# Patient Record
Sex: Male | Born: 1970 | Race: Black or African American | Hispanic: No | Marital: Single | Smoking: Never smoker
Health system: Southern US, Community
[De-identification: ages and names within clinical notes are randomized; demographics above are authoritative.]

## PROBLEM LIST (undated history)

## (undated) DIAGNOSIS — Z6828 Body mass index (BMI) 28.0-28.9, adult: Secondary | ICD-10-CM

## (undated) HISTORY — DX: Body mass index (BMI) 28.0-28.9, adult: Z68.28

## (undated) HISTORY — PX: FINGER TENDON REPAIR: SHX1640

---

## 2000-02-10 ENCOUNTER — Encounter: Admission: RE | Admit: 2000-02-10 | Discharge: 2000-02-10 | Payer: Self-pay | Admitting: Family Medicine

## 2000-02-22 ENCOUNTER — Encounter: Admission: RE | Admit: 2000-02-22 | Discharge: 2000-02-22 | Payer: Self-pay | Admitting: Sports Medicine

## 2006-02-19 ENCOUNTER — Emergency Department (HOSPITAL_COMMUNITY): Admission: EM | Admit: 2006-02-19 | Discharge: 2006-02-19 | Payer: Self-pay | Admitting: Emergency Medicine

## 2011-10-19 ENCOUNTER — Emergency Department (HOSPITAL_COMMUNITY)
Admission: EM | Admit: 2011-10-19 | Discharge: 2011-10-19 | Disposition: A | Discharge status: Home / Self Care | Payer: Self-pay | Attending: Emergency Medicine | Admitting: Emergency Medicine

## 2011-10-19 ENCOUNTER — Encounter (HOSPITAL_COMMUNITY): Payer: Self-pay | Admitting: *Deleted

## 2011-10-19 ENCOUNTER — Emergency Department (HOSPITAL_COMMUNITY): Payer: Self-pay

## 2011-10-19 DIAGNOSIS — M25569 Pain in unspecified knee: Secondary | ICD-10-CM | POA: Insufficient documentation

## 2011-10-19 DIAGNOSIS — X500XXA Overexertion from strenuous movement or load, initial encounter: Secondary | ICD-10-CM | POA: Insufficient documentation

## 2011-10-19 DIAGNOSIS — S8990XA Unspecified injury of unspecified lower leg, initial encounter: Secondary | ICD-10-CM | POA: Insufficient documentation

## 2011-10-19 MED ORDER — HYDROCODONE-ACETAMINOPHEN 5-325 MG PO TABS: 1.0000 | ORAL_TABLET | Freq: Four times a day (QID) | ORAL | Status: AC | PRN

## 2011-10-19 MED ORDER — NAPROXEN 500 MG PO TABS: 500.0000 mg | ORAL_TABLET | Freq: Two times a day (BID) | ORAL | Status: DC

## 2011-10-19 NOTE — ED Notes (Signed)
The patient is AOx4 and comfortable with his discharge instructions. 

## 2011-10-19 NOTE — Progress Notes (Signed)
Orthopedic Tech Progress Note Patient Details:  Colton Elliott 01/29/1971 [id number]  Ortho Devices Type of Ortho Device: Crutches;Knee Immobilizer Ortho Device/Splint Location: applied to Right LE Ortho Device/Splint Interventions: Application   Asia R Thompson 10/19/2011, 6:35 AM
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
## 2012-08-26 ENCOUNTER — Emergency Department (HOSPITAL_COMMUNITY)
Admission: EM | Admit: 2012-08-26 | Discharge: 2012-08-26 | Disposition: A | Discharge status: Home / Self Care | Payer: Self-pay | Attending: Emergency Medicine | Admitting: Emergency Medicine

## 2012-08-26 ENCOUNTER — Encounter (HOSPITAL_COMMUNITY): Payer: Self-pay | Admitting: Emergency Medicine

## 2012-08-26 DIAGNOSIS — S61012A Laceration without foreign body of left thumb without damage to nail, initial encounter: Secondary | ICD-10-CM

## 2012-08-26 DIAGNOSIS — Y9389 Activity, other specified: Secondary | ICD-10-CM | POA: Insufficient documentation

## 2012-08-26 DIAGNOSIS — S61209A Unspecified open wound of unspecified finger without damage to nail, initial encounter: Secondary | ICD-10-CM | POA: Insufficient documentation

## 2012-08-26 DIAGNOSIS — Y9289 Other specified places as the place of occurrence of the external cause: Secondary | ICD-10-CM | POA: Insufficient documentation

## 2012-08-26 DIAGNOSIS — Z9889 Other specified postprocedural states: Secondary | ICD-10-CM | POA: Insufficient documentation

## 2012-08-26 DIAGNOSIS — W268XXA Contact with other sharp object(s), not elsewhere classified, initial encounter: Secondary | ICD-10-CM | POA: Insufficient documentation

## 2012-08-26 DIAGNOSIS — Z23 Encounter for immunization: Secondary | ICD-10-CM | POA: Insufficient documentation

## 2012-08-26 MED ORDER — LIDOCAINE HCL 1 % IJ SOLN
INTRAMUSCULAR | Status: AC
  Filled 2012-08-26: qty 20

## 2012-08-26 MED ORDER — TETANUS-DIPHTH-ACELL PERTUSSIS 5-2.5-18.5 LF-MCG/0.5 IM SUSP
0.5000 mL | Freq: Once | INTRAMUSCULAR | Status: AC
  Administered 2012-08-26: 0.5 mL via INTRAMUSCULAR
  Filled 2012-08-26: qty 0.5

## 2012-08-26 NOTE — ED Notes (Signed)
Patient is alert and oriented x3.  He was given DC instructions and follow up visit instructions.  Patient gave verbal understanding.  He was DC ambulatory under his own power to home.  V/S stable.  He was not showing any signs of distress on DC 

## 2012-08-26 NOTE — ED Provider Notes (Signed)
History     CSN: [id number]  Arrival date & time 08/26/12  0006   First MD Initiated Contact with Patient 08/26/12 0104      Chief Complaint  Patient presents with  . Extremity Laceration   HPI  History provided by the patient. The patient is a 42 year old male with no significant PMH presents with laceration to his left thumb. Patient states he is removing trash, trash can and as he reached it was his left hand to grab the bottom of the back he cut his thumb on something sharp. He is unsure what cut his thumb. There was associated bleeding but this was controlled with pressure. He denied having any reduced range of motion of the thumb. There was no weakness or numbness to the thumb. No other injuries. No other aggravating or alleviating factors. No other associated symptoms.    History reviewed. No pertinent past medical history.  Past Surgical History  Procedure Laterality Date  . Finger tendon repair      Family History  Problem Relation Age of Onset  . Hypertension Mother   . Diabetes Father     History  Substance Use Topics  . Smoking status: Never Smoker   . Smokeless tobacco: Never Used  . Alcohol Use: No      Review of Systems  Neurological: Negative for weakness and numbness.  All other systems reviewed and are negative.    Allergies  Review of patient's allergies indicates no known allergies.  Home Medications   Current Outpatient Rx  Name  Route  Sig  Dispense  Refill  . guaiFENesin (MUCINEX) 600 MG 12 hr tablet   Oral   Take 1,200 mg by mouth 2 (two) times daily as needed for congestion.         Marland Kitchen loratadine (CLARITIN) 10 MG tablet   Oral   Take 10 mg by mouth daily.           BP 136/78  Pulse 84  Temp(Src) 98.2 F (36.8 C) (Oral)  Resp 14  Ht 5\' 7"  (1.702 m)  Wt 165 lb (74.844 kg)  BMI 25.84 kg/m2  SpO2 96%  Physical Exam  Nursing note and vitals reviewed. Constitutional: He is oriented to person, place, and time. He appears  well-developed and well-nourished. No distress.  HENT:  Head: Normocephalic and atraumatic.  Cardiovascular: Normal rate and regular rhythm.   Pulmonary/Chest: Effort normal and breath sounds normal.  Musculoskeletal: Normal range of motion.  Semicircular laceration to the palmar surface of the proximal left thumb. No deep structure involvement through full range of motion. Normal strength against resistance in all directions of thumb. Normal distal mediolateral sensations to the thumb. Normal cap refill less than 2 seconds.  Neurological: He is alert and oriented to person, place, and time.  Skin: Skin is warm.  Psychiatric: He has a normal mood and affect. His behavior is normal.    ED Course  Procedures   LACERATION REPAIR Performed by: Angus Seller Authorized by: Angus Seller Consent: Verbal consent obtained. Risks and benefits: risks, benefits and alternatives were discussed Consent given by: patient Patient identity confirmed: provided demographic data Prepped and Draped in normal sterile fashion Wound explored  Laceration Location: Left thumb  Laceration Length: 2 cm  No Foreign Bodies seen or palpated  Anesthesia: local infiltration  Local anesthetic: lidocaine 2% without epinephrine  Anesthetic total: 3 ml  Irrigation method: syringe Amount of cleaning: standard  Skin closure: Skin with 4-0 Prolene   Number  of sutures: 4   Technique: Simple interrupted   Patient tolerance: Patient tolerated the procedure well with no immediate complications.     1. Laceration of thumb, left, initial encounter       MDM  Patient seen and evaluated. Patient well-appearing in no acute distress. Patient unsure of last tetanus. Tetanus updated today.     Angus Seller, PA-C 08/26/12 [phone]
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
## 2012-08-29 NOTE — ED Provider Notes (Signed)
Medical screening examination/treatment/procedure(s) were performed by non-physician practitioner and as supervising physician I was immediately available for consultation/collaboration.   Gilda Crease, MD 08/29/12 [phone]

## 2013-04-14 IMAGING — CR DG KNEE COMPLETE 4+V*R*
4 series · 4 of 4 positions shown · non-contrast
Comparison: None.

CLINICAL DATA: Pain and stiffness after twisting injury.

RIGHT KNEE - COMPLETE 4+ VIEW

[t knee ap right]
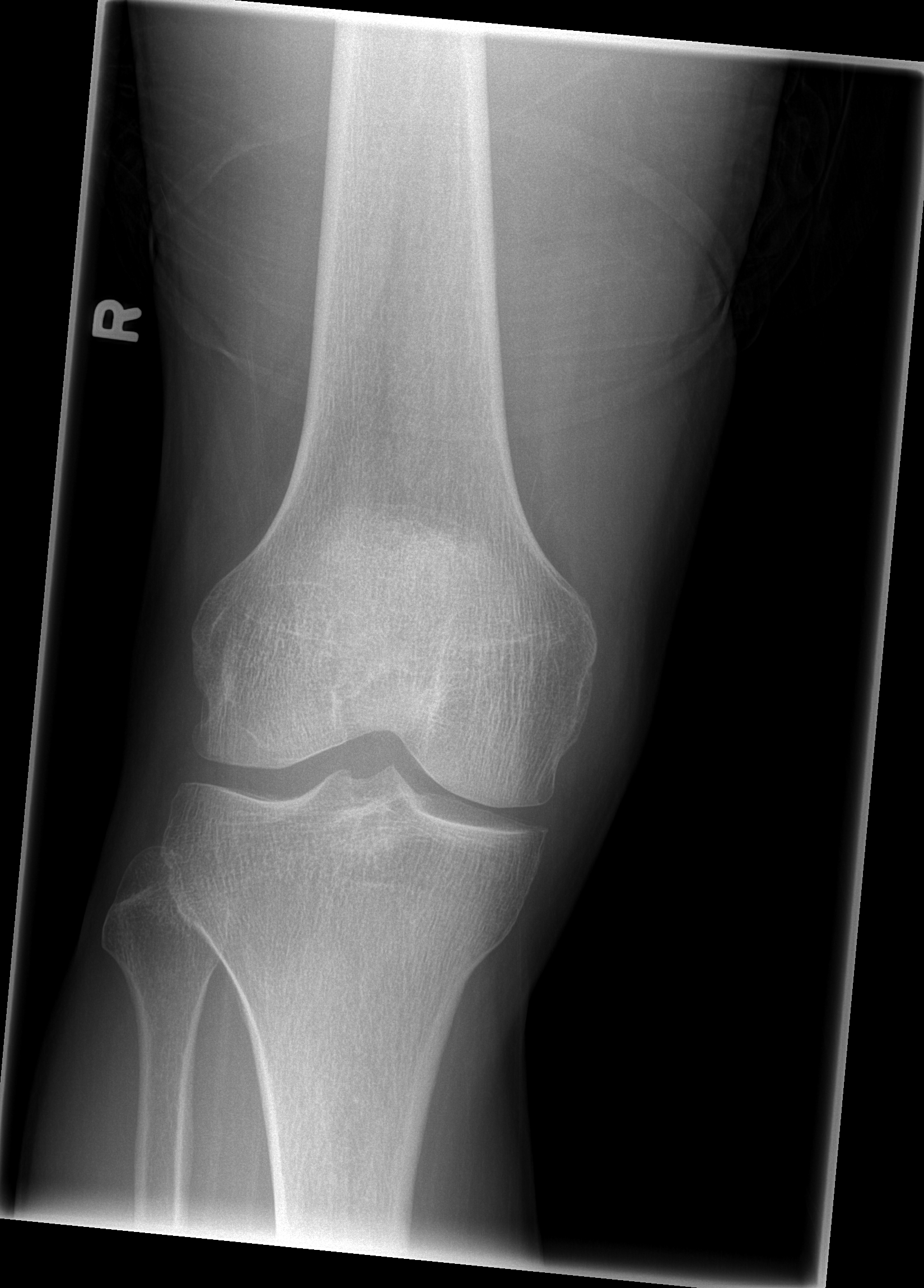

[t knee oblique right (1 of 2)]
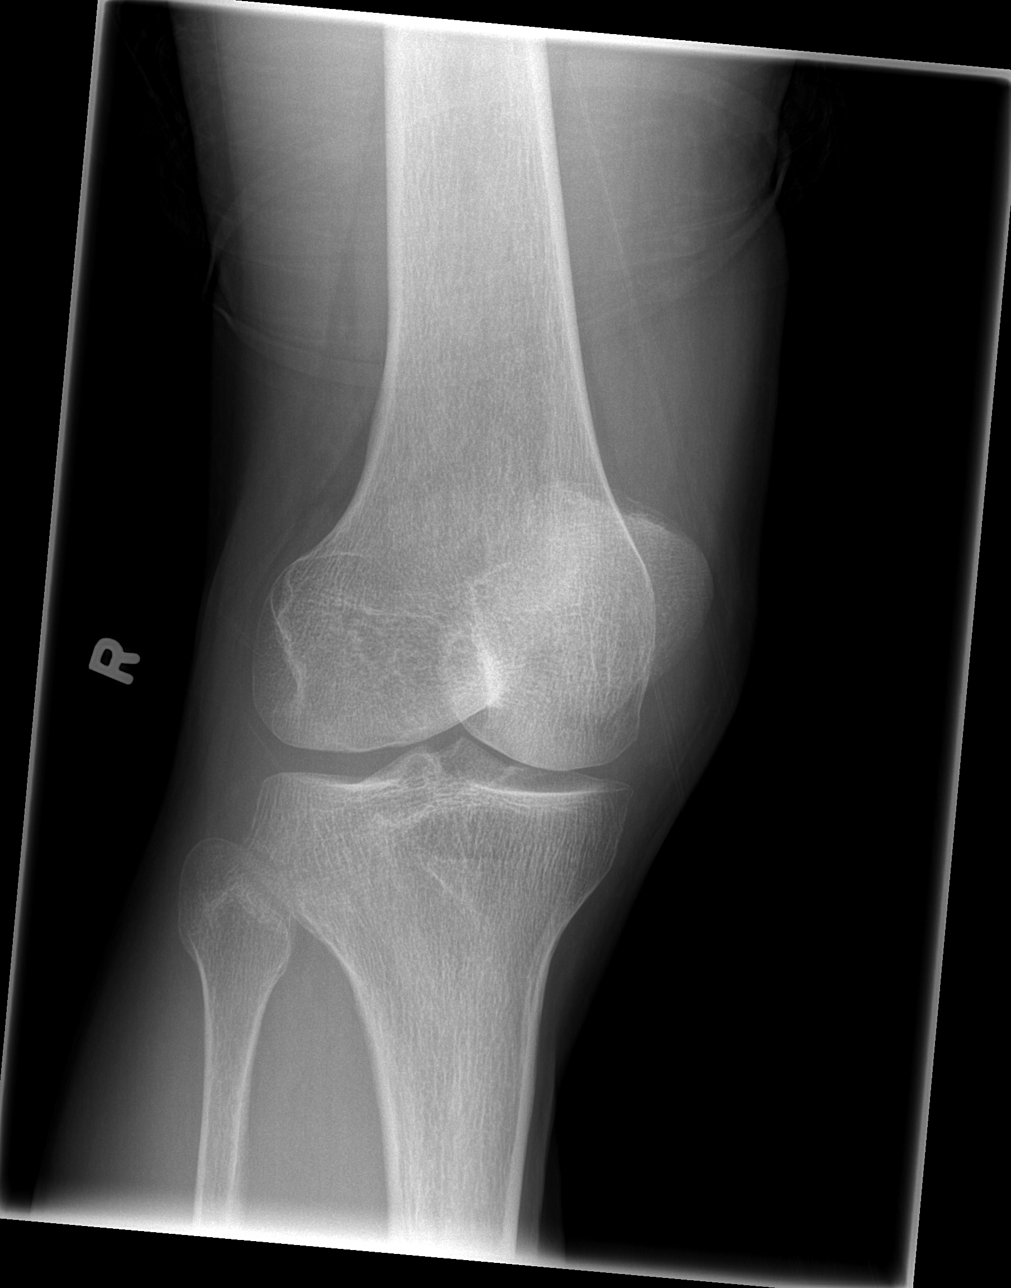

[t knee oblique right (2 of 2)]
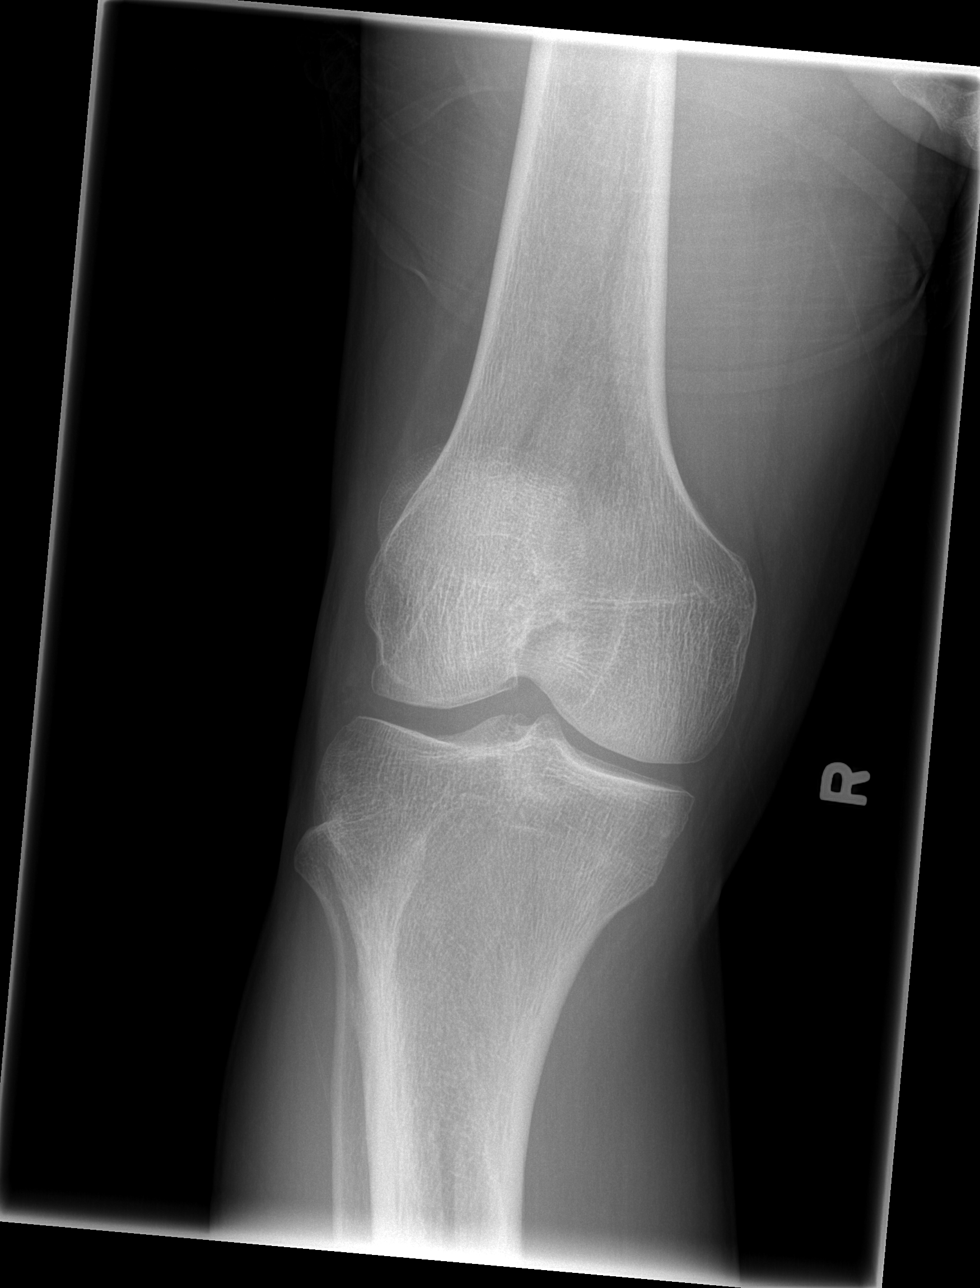

[t knee lat right]
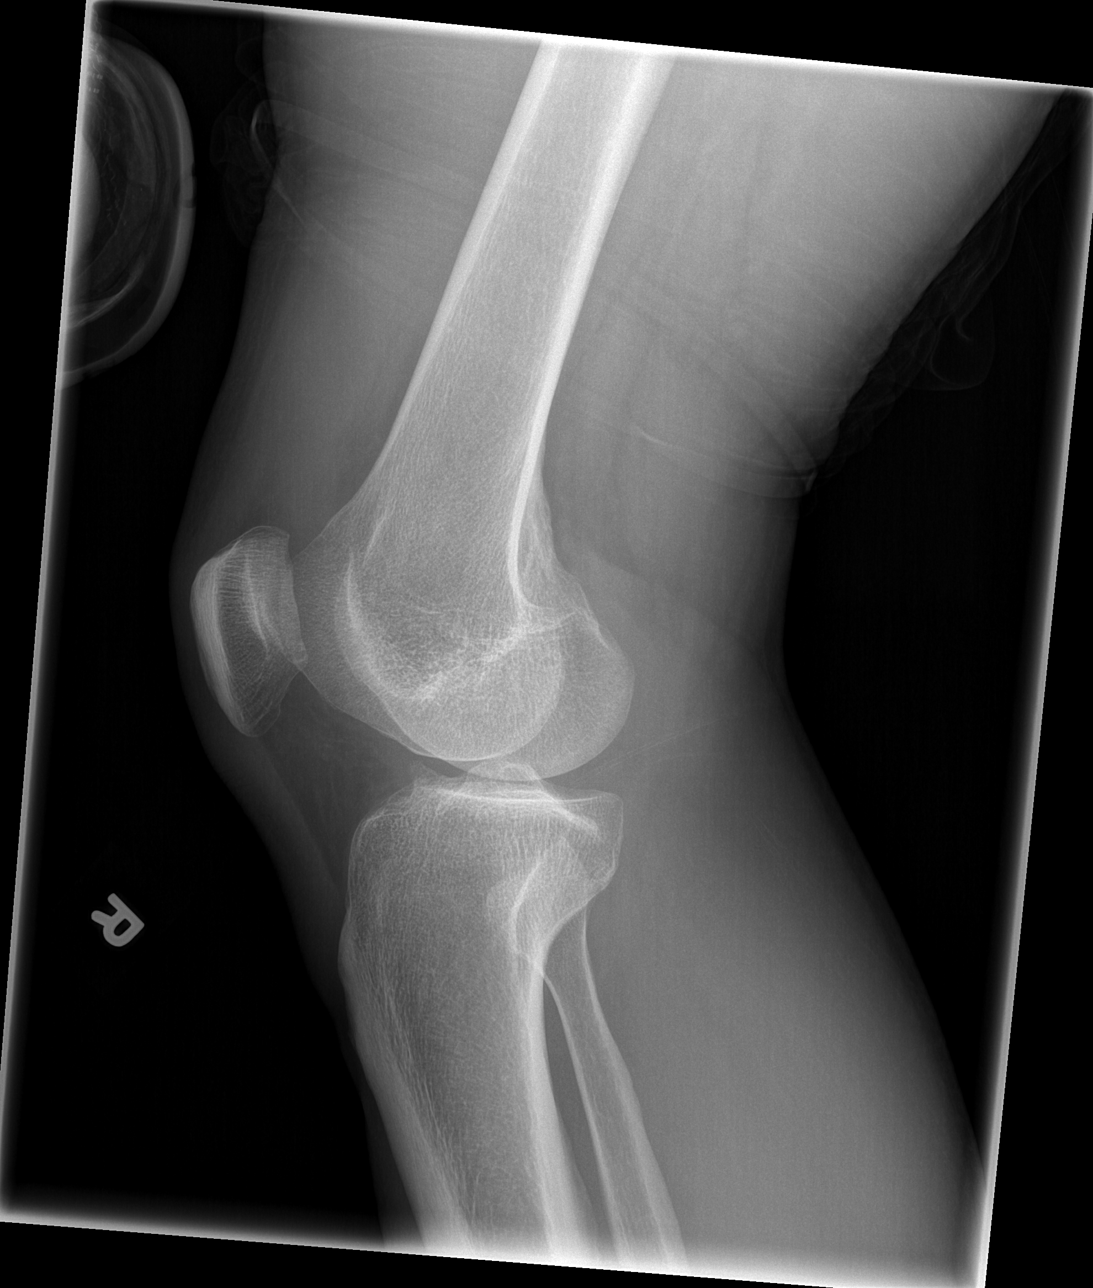

[4 of 4 positions shown; findings below may reference images not displayed]

FINDINGS: Small right knee effusion.  Right knee appears otherwise
intact.  No evidence of acute fracture or subluxation.  No focal
bone lesion or bone destruction.  Bone cortex and trabecular
architecture appear intact.  No radiopaque soft tissue foreign
bodies.
IMPRESSION: Small right knee effusion.  No acute bony abnormalities.

## 2014-05-02 ENCOUNTER — Ambulatory Visit: Payer: Self-pay | Admitting: Medical

## 2019-12-09 ENCOUNTER — Encounter: Payer: Self-pay | Admitting: Physician Assistant

## 2019-12-09 ENCOUNTER — Other Ambulatory Visit: Payer: Self-pay | Admitting: Physician Assistant

## 2019-12-09 DIAGNOSIS — Z6828 Body mass index (BMI) 28.0-28.9, adult: Secondary | ICD-10-CM | POA: Insufficient documentation

## 2019-12-09 DIAGNOSIS — U071 COVID-19: Secondary | ICD-10-CM

## 2019-12-09 NOTE — Progress Notes (Signed)
I connected by phone with Colton Elliott on 12/09/2019 at 2:32 PM to discuss the potential use of a new treatment for mild to moderate COVID-19 viral infection in non-hospitalized patients.  This patient is a 49 y.o. male that meets the FDA criteria for Emergency Use Authorization of COVID monoclonal antibody casirivimab/imdevimab.  Has a (+) direct SARS-CoV-2 viral test result  Has mild or moderate COVID-19   Is NOT hospitalized due to COVID-19  Is within 10 days of symptom onset  Has at least one of the high risk factor(s) for progression to severe COVID-19 and/or hospitalization as defined in EUA.  Specific high risk criteria : BMI > 25   I have spoken and communicated the following to the patient or parent/caregiver regarding COVID monoclonal antibody treatment:  1. FDA has authorized the emergency use for the treatment of mild to moderate COVID-19 in adults and pediatric patients with positive results of direct SARS-CoV-2 viral testing who are 26 years of age and older weighing at least 40 kg, and who are at high risk for progressing to severe COVID-19 and/or hospitalization.  2. The significant known and potential risks and benefits of COVID monoclonal antibody, and the extent to which such potential risks and benefits are unknown.  3. Information on available alternative treatments and the risks and benefits of those alternatives, including clinical trials.  4. Patients treated with COVID monoclonal antibody should continue to self-isolate and use infection control measures (e.g., wear mask, isolate, social distance, avoid sharing personal items, clean and disinfect "high touch" surfaces, and frequent handwashing) according to CDC guidelines.   5. The patient or parent/caregiver has the option to accept or refuse COVID monoclonal antibody treatment.  After reviewing this information with the patient, The patient agreed to proceed with receiving casirivimab\imdevimab infusion and  will be provided a copy of the Fact sheet prior to receiving the infusion.  Sx onset 9/2. Set up for infusion on 9/6 @ 10:30am. Directions given to [facility] Pt is aware that insurance will be charged an infusion fee. Pt is unvaccinated.   Cline Crock 12/09/2019 2:32 PM

## 2019-12-10 ENCOUNTER — Ambulatory Visit (HOSPITAL_COMMUNITY)
Admission: RE | Admit: 2019-12-10 | Discharge: 2019-12-10 | Disposition: A | Discharge status: Home / Self Care | Payer: BC Managed Care – PPO | Source: Ambulatory Visit | Attending: Pulmonary Disease | Admitting: Pulmonary Disease

## 2019-12-10 DIAGNOSIS — U071 COVID-19: Secondary | ICD-10-CM | POA: Diagnosis present

## 2019-12-10 DIAGNOSIS — Z6828 Body mass index (BMI) 28.0-28.9, adult: Secondary | ICD-10-CM

## 2019-12-10 MED ORDER — ALBUTEROL SULFATE HFA 108 (90 BASE) MCG/ACT IN AERS: 2.0000 | INHALATION_SPRAY | Freq: Once | RESPIRATORY_TRACT | Status: DC | PRN

## 2019-12-10 MED ORDER — DIPHENHYDRAMINE HCL 50 MG/ML IJ SOLN: 50.0000 mg | Freq: Once | INTRAMUSCULAR | Status: DC | PRN

## 2019-12-10 MED ORDER — EPINEPHRINE 0.3 MG/0.3ML IJ SOAJ: 0.3000 mg | Freq: Once | INTRAMUSCULAR | Status: DC | PRN

## 2019-12-10 MED ORDER — FAMOTIDINE IN NACL 20-0.9 MG/50ML-% IV SOLN: 20.0000 mg | Freq: Once | INTRAVENOUS | Status: DC | PRN

## 2019-12-10 MED ORDER — METHYLPREDNISOLONE SODIUM SUCC 125 MG IJ SOLR: 125.0000 mg | Freq: Once | INTRAMUSCULAR | Status: DC | PRN

## 2019-12-10 MED ORDER — SODIUM CHLORIDE 0.9 % IV SOLN
1200.0000 mg | Freq: Once | INTRAVENOUS | Status: AC
  Administered 2019-12-10: 1200 mg via INTRAVENOUS
  Filled 2019-12-10: qty 10

## 2019-12-10 MED ORDER — SODIUM CHLORIDE 0.9 % IV SOLN: INTRAVENOUS | Status: DC | PRN

## 2019-12-10 NOTE — Discharge Instructions (Signed)

## 2019-12-10 NOTE — Progress Notes (Signed)
  Diagnosis: COVID-19  Physician: Wright   Procedure: [facility] Med: casirivimab\imdevimab infusion - Provided patient with casirivimab\imdevimab fact sheet for patients, parents and caregivers prior to infusion.  Complications: No immediate complications noted.  Discharge: Discharged home   Colton Elliott 12/10/2019
